# Patient Record
Sex: Female | Born: 1980 | Race: Black or African American | Hispanic: No | Marital: Single | Smoking: Never smoker
Health system: Southern US, Community
[De-identification: ages and names within clinical notes are randomized; demographics above are authoritative.]

---

## 2007-02-10 ENCOUNTER — Inpatient Hospital Stay: Payer: Self-pay | Admitting: Unknown Physician Specialty

## 2007-06-30 ENCOUNTER — Encounter (HOSPITAL_COMMUNITY): Admission: RE | Admit: 2007-06-30 | Discharge: 2007-07-30 | Payer: Self-pay | Admitting: Endocrinology

## 2007-09-22 ENCOUNTER — Encounter (HOSPITAL_COMMUNITY): Admission: RE | Admit: 2007-09-22 | Discharge: 2007-10-22 | Payer: Self-pay | Admitting: Endocrinology

## 2009-06-05 ENCOUNTER — Ambulatory Visit: Payer: Self-pay | Admitting: Endocrinology

## 2016-10-15 DIAGNOSIS — F209 Schizophrenia, unspecified: Secondary | ICD-10-CM | POA: Insufficient documentation

## 2019-06-14 DIAGNOSIS — E0590 Thyrotoxicosis, unspecified without thyrotoxic crisis or storm: Secondary | ICD-10-CM | POA: Insufficient documentation

## 2021-07-31 ENCOUNTER — Ambulatory Visit (INDEPENDENT_AMBULATORY_CARE_PROVIDER_SITE_OTHER): Payer: Medicaid Other | Admitting: Podiatry

## 2021-07-31 DIAGNOSIS — M79671 Pain in right foot: Secondary | ICD-10-CM

## 2021-07-31 DIAGNOSIS — M79672 Pain in left foot: Secondary | ICD-10-CM | POA: Diagnosis not present

## 2021-07-31 NOTE — Progress Notes (Signed)
? ?  HPI: 41 y.o. female presenting today as a new patient referral from [facility] that the patient lives at for a routine foot exam.  Patient has no pedal complaints.  She trims her own nails.  She states that she is unsure why she was referred here.  She presents for routine evaluation today ? ?No past medical history on file. ? ?No Known Allergies ?  ?Physical Exam: ?General: The patient is alert and oriented x3 in no acute distress. ? ?Dermatology: Skin is warm, dry and supple bilateral lower extremities. Negative for open lesions or macerations. ? ?Vascular: Palpable pedal pulses bilaterally. Capillary refill within normal limits.  Negative for any significant edema or erythema ? ?Neurological: Light touch and protective threshold grossly intact ? ?Musculoskeletal Exam: No pedal deformities noted ? ? ?Assessment: ?1.  Annual lower extremity foot exam ? ? ?Plan of Care:  ?1. Patient evaluated.  ?2.  Patient has no complaints today.  She declined any routine foot care ?3.  Comprehensive exam performed.  Patient doing well ?4.  Recommend good supportive shoes and sneakers ?5.  Return to clinic as needed ?  ?  ?Edrick Kins, DPM ?[place] ? ?Dr. Edrick Kins, DPM  ?  ?[address]                                        ?[place], [place] [address]                ?Office [phone]  ?Fax [phone] ? ? ? ? ?

## 2022-01-22 ENCOUNTER — Ambulatory Visit: Payer: Medicaid Other | Admitting: Podiatry

## 2022-01-29 ENCOUNTER — Ambulatory Visit (INDEPENDENT_AMBULATORY_CARE_PROVIDER_SITE_OTHER): Payer: Medicaid Other | Admitting: Podiatry

## 2022-01-29 DIAGNOSIS — M79675 Pain in left toe(s): Secondary | ICD-10-CM | POA: Diagnosis not present

## 2022-01-29 DIAGNOSIS — M79674 Pain in right toe(s): Secondary | ICD-10-CM | POA: Diagnosis not present

## 2022-01-29 DIAGNOSIS — B351 Tinea unguium: Secondary | ICD-10-CM

## 2022-01-29 NOTE — Progress Notes (Signed)
  Subjective:  Patient ID: Tracey Daniels, female    DOB: 1981-01-05,  MRN: [id number]  Chief Complaint  Patient presents with   Nail Problem   41 y.o. female returns for the above complaint.  Patient presents with thickened elongated dystrophic toenails x10 mild pain on palpation.  She would like for me to debride the nails down.  She has not seen anyone else prior to seeing me for this.  She denies any other acute complaints.  Objective:  There were no vitals filed for this visit. Podiatric Exam: Vascular: dorsalis pedis and posterior tibial pulses are palpable bilateral. Capillary return is immediate. Temperature gradient is WNL. Skin turgor WNL  Sensorium: Normal Semmes Weinstein monofilament test. Normal tactile sensation bilaterally. Nail Exam: Pt has thick disfigured discolored nails with subungual debris noted bilateral entire nail hallux through fifth toenails.  Pain on palpation to the nails. Ulcer Exam: There is no evidence of ulcer or pre-ulcerative changes or infection. Orthopedic Exam: Muscle tone and strength are WNL. No limitations in general ROM. No crepitus or effusions noted.  Skin: No Porokeratosis. No infection or ulcers    Assessment & Plan:   1. Pain due to onychomycosis of toenails of both feet     Patient was evaluated and treated and all questions answered.  Onychomycosis with pain  -Nails palliatively debrided as below. -Educated on self-care  Procedure: Nail Debridement Rationale: pain  Type of Debridement: manual, sharp debridement. Instrumentation: Nail nipper, rotary burr. Number of Nails: 10  Procedures and Treatment: Consent by patient was obtained for treatment procedures. The patient understood the discussion of treatment and procedures well. All questions were answered thoroughly reviewed. Debridement of mycotic and hypertrophic toenails, 1 through 5 bilateral and clearing of subungual debris. No ulceration, no infection noted.  Return  Visit-Office Procedure: Patient instructed to return to the office for a follow up visit 3 months for continued evaluation and treatment.  Boneta Lucks, DPM    No follow-ups on file.

## 2022-03-26 ENCOUNTER — Other Ambulatory Visit: Payer: Self-pay | Admitting: Obstetrics and Gynecology

## 2022-03-26 ENCOUNTER — Other Ambulatory Visit (HOSPITAL_COMMUNITY): Payer: Self-pay | Admitting: Obstetrics and Gynecology

## 2022-03-26 DIAGNOSIS — D251 Intramural leiomyoma of uterus: Secondary | ICD-10-CM

## 2022-05-09 ENCOUNTER — Ambulatory Visit: Payer: Medicaid Other | Admitting: Podiatry

## 2022-08-08 ENCOUNTER — Ambulatory Visit: Payer: Medicaid Other | Admitting: Podiatry

## 2022-08-23 ENCOUNTER — Ambulatory Visit: Payer: Medicaid Other | Admitting: Podiatry

## 2022-09-16 ENCOUNTER — Ambulatory Visit (INDEPENDENT_AMBULATORY_CARE_PROVIDER_SITE_OTHER): Payer: Medicaid Other | Admitting: Podiatry

## 2022-09-16 VITALS — BP 112/77

## 2022-09-16 DIAGNOSIS — M79675 Pain in left toe(s): Secondary | ICD-10-CM

## 2022-09-16 DIAGNOSIS — M79674 Pain in right toe(s): Secondary | ICD-10-CM

## 2022-09-16 DIAGNOSIS — B351 Tinea unguium: Secondary | ICD-10-CM | POA: Diagnosis not present

## 2022-09-16 NOTE — Progress Notes (Signed)
  Subjective:  Patient ID: Tracey Daniels, female    DOB: Mar 01, 1981,  MRN: [id number]  Tracey Daniels presents to clinic today for painful elongated mycotic toenails 1-5 bilaterally which are tender when wearing enclosed shoe gear. Pain is relieved with periodic professional debridement. Chief Complaint  Patient presents with   Nail Problem    RFC,   New problem(s): None.  No Known Allergies  Review of Systems: Negative except as noted in the HPI. Objective:   Constitutional Tracey Daniels is a pleasant 42 y.o. female, in NAD. AAO x 3.   Vascular Vascular Examination: Capillary refill time immediate b/l. Vascular status intact b/l with palpable pedal pulses. Pedal hair present b/l. No edema. No pain with calf compression b/l. Skin temperature gradient WNL b/l. No cyanosis or clubbing b/l.   Neurological Examination: Sensation grossly intact b/l with 10 gram monofilament. Vibratory sensation intact b/l.   Dermatological Examination: Pedal skin with normal turgor, texture and tone b/l.  No open wounds. No interdigital macerations.   Toenails 1-5 b/l thick, discolored, elongated with subungual debris and pain on dorsal palpation.   Hyperkeratotic lesion(s) submet head 5 b/l.  No erythema, no edema, no drainage, no fluctuance.  Musculoskeletal Examination: Normal muscle strength 5/5 to all lower extremity muscle groups bilaterally. No pain, crepitus or joint limitation noted with ROM b/l LE. No gross bony pedal deformities b/l. Patient ambulates independently without assistive aids.  Radiographs: None   Assessment:   1. Pain due to onychomycosis of toenails of both feet    Plan:  Patient was evaluated and treated and all questions answered. Consent given for treatment as described below: Patient was evaluated and treated. All patient's and/or POA's questions/concerns addressed on today's visit. Toenails 1-5 debrided in length and girth without incident. Continue soft,  supportive shoe gear daily. Report any pedal injuries to medical professional. Call office if there are any questions/concerns. -Toenails 1-5 b/l were debrided in length and girth with sterile nail nippers and dremel without iatrogenic bleeding.  -As a courtesy, callus(es) submet head 5 b/l pared utilizing sterile scalpel blade without complication or incident. Total number pared=2. -Patient/POA to call should there be question/concern in the interim.  Return in about 3 months (around 12/17/2022).  Tracey Daniels, DPM

## 2022-09-21 ENCOUNTER — Encounter: Payer: Self-pay | Admitting: Podiatry

## 2022-11-28 ENCOUNTER — Telehealth: Payer: Self-pay | Admitting: Podiatry

## 2022-11-28 NOTE — Telephone Encounter (Signed)
Pt's caregiver called to inquire about a foot cream or lotion that pt is to apply to feet daily; he wanted to know if this is an OTC or Rx that's needed. He stated there has not been a Rx received at the pharmacy for her. Please advise

## 2023-05-22 ENCOUNTER — Ambulatory Visit: Payer: MEDICAID | Admitting: Podiatry

## 2023-05-29 ENCOUNTER — Ambulatory Visit: Payer: MEDICAID | Admitting: Podiatry

## 2023-06-23 ENCOUNTER — Ambulatory Visit (INDEPENDENT_AMBULATORY_CARE_PROVIDER_SITE_OTHER): Payer: MEDICAID | Admitting: Podiatry

## 2023-06-23 DIAGNOSIS — Z91198 Patient's noncompliance with other medical treatment and regimen for other reason: Secondary | ICD-10-CM

## 2023-06-23 NOTE — Progress Notes (Signed)
 Failure to attend appointment with reason given Transportation issues.

## 2023-07-04 ENCOUNTER — Ambulatory Visit: Payer: MEDICAID | Admitting: Podiatry

## 2023-11-17 ENCOUNTER — Ambulatory Visit (INDEPENDENT_AMBULATORY_CARE_PROVIDER_SITE_OTHER): Payer: MEDICAID | Admitting: Podiatry

## 2023-11-17 ENCOUNTER — Encounter: Payer: Self-pay | Admitting: Podiatry

## 2023-11-17 DIAGNOSIS — M79674 Pain in right toe(s): Secondary | ICD-10-CM

## 2023-11-17 DIAGNOSIS — B351 Tinea unguium: Secondary | ICD-10-CM

## 2023-11-17 DIAGNOSIS — M79675 Pain in left toe(s): Secondary | ICD-10-CM | POA: Diagnosis not present

## 2023-11-20 NOTE — Progress Notes (Signed)
  Subjective:  Patient ID: Tracey Daniels, female    DOB: 16-Aug-1980,  MRN: [id number]  Tracey Daniels presents to clinic today for painful thick toenails that are difficult to trim. Pain interferes with ambulation. Aggravating factors include wearing enclosed shoe gear. Pain is relieved with periodic professional debridement. She is a resident of [facility]. Chief Complaint  Patient presents with   [facility]    Rm2 Routine foot care/ Dr. Bari Admire   New problem(s): None.   No Known Allergies  Review of Systems: Negative except as noted in the HPI.  Objective: No changes noted in today's physical examination. There were no vitals filed for this visit. Tracey Daniels is a pleasant 43 y.o. female in NAD. AAO x 3.  Vascular Examination: Capillary refill time immediate b/l. Vascular status intact b/l with palpable pedal pulses. Pedal hair present b/l. No pain with calf compression b/l. Skin temperature gradient WNL b/l. No cyanosis or clubbing b/l. No ischemia or gangrene noted b/l.   Neurological Examination: Sensation grossly intact b/l with 10 gram monofilament. Vibratory sensation intact b/l.   Dermatological Examination: Pedal skin with normal turgor, texture and tone b/l.  No open wounds. No interdigital macerations.   Toenails 1-5 b/l thick, discolored, elongated with subungual debris and pain on dorsal palpation.   No hyperkeratotic nor porokeratotic lesions.  Musculoskeletal Examination: Muscle strength 5/5 to all lower extremity muscle groups bilaterally. No pain, crepitus or joint limitation noted with ROM b/l LE. No gross bony pedal deformities b/l. Patient ambulates independently without assistive aids.  Radiographs: None  Assessment/Plan: 1. Pain due to onychomycosis of toenails of both feet   Patient was evaluated and treated. All patient's and/or POA's questions/concerns addressed on today's visit. Mycotic toenails 1-5 debrided in length and  girth without incident. Continue soft, supportive shoe gear daily. Report any pedal injuries to medical professional. Call office if there are any quesitons/concerns. -Patient/POA to call should there be question/concern in the interim.   Return in about 3 months (around 02/17/2024).  Delon LITTIE Merlin, DPM       LOCATION: [address] [place] [address]                   Office [phone]   [facility] LOCATION: [address] [place], [place] [address] Office [phone]

## 2024-08-04 ENCOUNTER — Ambulatory Visit: Payer: MEDICAID | Admitting: Urology

## 2024-08-06 ENCOUNTER — Ambulatory Visit: Payer: MEDICAID | Admitting: Urology
